# Patient Record
Sex: Female | Born: 1970 | Hispanic: Yes | Marital: Married | Smoking: Never smoker
Health system: Southern US, Community
[De-identification: ages and names within clinical notes are randomized; demographics above are authoritative.]

## PROBLEM LIST (undated history)

## (undated) DIAGNOSIS — R519 Headache, unspecified: Secondary | ICD-10-CM

## (undated) DIAGNOSIS — D649 Anemia, unspecified: Secondary | ICD-10-CM

## (undated) DIAGNOSIS — R51 Headache: Secondary | ICD-10-CM

## (undated) DIAGNOSIS — I499 Cardiac arrhythmia, unspecified: Secondary | ICD-10-CM

## (undated) HISTORY — PX: TUBAL LIGATION: SHX77

---

## 2007-09-10 ENCOUNTER — Ambulatory Visit: Payer: Self-pay

## 2007-10-01 ENCOUNTER — Ambulatory Visit: Payer: Self-pay

## 2013-02-12 ENCOUNTER — Emergency Department: Payer: Self-pay | Admitting: Emergency Medicine

## 2013-02-12 LAB — COMPREHENSIVE METABOLIC PANEL
Albumin: 3.6 g/dL (ref 3.4–5.0)
Alkaline Phosphatase: 107 U/L
BUN: 17 mg/dL (ref 7–18)
Calcium, Total: 8.2 mg/dL — ABNORMAL LOW (ref 8.5–10.1)
Co2: 27 mmol/L (ref 21–32)
Creatinine: 0.68 mg/dL (ref 0.60–1.30)
EGFR (Non-African Amer.): >60
Potassium: 3.8 mmol/L (ref 3.5–5.1)
Sodium: 141 mmol/L (ref 136–145)
Total Protein: 6.9 g/dL (ref 6.4–8.2)

## 2013-02-12 LAB — CBC
MCH: 21.8 pg — ABNORMAL LOW (ref 26.0–34.0)
MCHC: 31.8 g/dL — ABNORMAL LOW (ref 32.0–36.0)
MCV: 69 fL — ABNORMAL LOW (ref 80–100)
Platelet: 322 10*3/uL (ref 150–440)
RBC: 4.73 10*6/uL (ref 3.80–5.20)
WBC: 8.5 10*3/uL (ref 3.6–11.0)

## 2013-02-12 LAB — TSH: Thyroid Stimulating Horm: 2.03 u[IU]/mL

## 2014-11-30 ENCOUNTER — Other Ambulatory Visit: Payer: Self-pay | Admitting: Neurology

## 2014-11-30 DIAGNOSIS — R519 Headache, unspecified: Secondary | ICD-10-CM

## 2014-11-30 DIAGNOSIS — R51 Headache: Principal | ICD-10-CM

## 2014-12-08 ENCOUNTER — Ambulatory Visit: Admission: RE | Admit: 2014-12-08 | Payer: Managed Care, Other (non HMO) | Source: Ambulatory Visit

## 2014-12-08 ENCOUNTER — Ambulatory Visit
Admission: RE | Admit: 2014-12-08 | Discharge: 2014-12-08 | Disposition: A | Discharge status: Home / Self Care | Payer: Managed Care, Other (non HMO) | Source: Ambulatory Visit | Attending: Neurology | Admitting: Neurology

## 2014-12-08 DIAGNOSIS — R51 Headache: Secondary | ICD-10-CM | POA: Diagnosis present

## 2014-12-08 DIAGNOSIS — G8929 Other chronic pain: Secondary | ICD-10-CM

## 2016-01-04 ENCOUNTER — Encounter: Payer: Self-pay | Admitting: *Deleted

## 2016-01-05 ENCOUNTER — Ambulatory Visit: Payer: Self-pay | Admitting: Anesthesiology

## 2016-01-05 ENCOUNTER — Encounter: Payer: Self-pay | Admitting: *Deleted

## 2016-01-05 ENCOUNTER — Encounter
Admission: RE | Disposition: A | Discharge status: Home / Self Care | Payer: Self-pay | Source: Ambulatory Visit | Attending: Unknown Physician Specialty

## 2016-01-05 ENCOUNTER — Ambulatory Visit
Admission: RE | Admit: 2016-01-05 | Discharge: 2016-01-05 | Disposition: A | Discharge status: Home / Self Care | Payer: Self-pay | Source: Ambulatory Visit | Attending: Unknown Physician Specialty | Admitting: Unknown Physician Specialty

## 2016-01-05 DIAGNOSIS — K319 Disease of stomach and duodenum, unspecified: Secondary | ICD-10-CM | POA: Insufficient documentation

## 2016-01-05 DIAGNOSIS — R12 Heartburn: Secondary | ICD-10-CM | POA: Insufficient documentation

## 2016-01-05 DIAGNOSIS — K449 Diaphragmatic hernia without obstruction or gangrene: Secondary | ICD-10-CM | POA: Insufficient documentation

## 2016-01-05 HISTORY — DX: Anemia, unspecified: D64.9

## 2016-01-05 HISTORY — PX: ESOPHAGOGASTRODUODENOSCOPY (EGD) WITH PROPOFOL: SHX5813

## 2016-01-05 HISTORY — DX: Headache: R51

## 2016-01-05 HISTORY — DX: Cardiac arrhythmia, unspecified: I49.9

## 2016-01-05 HISTORY — DX: Headache, unspecified: R51.9

## 2016-01-05 LAB — POCT PREGNANCY, URINE: PREG TEST UR: NEGATIVE

## 2016-01-05 SURGERY — ESOPHAGOGASTRODUODENOSCOPY (EGD) WITH PROPOFOL
Anesthesia: General

## 2016-01-05 MED ORDER — FENTANYL CITRATE (PF) 100 MCG/2ML IJ SOLN
INTRAMUSCULAR | Status: DC | PRN
  Administered 2016-01-05: 50 ug via INTRAVENOUS

## 2016-01-05 MED ORDER — SODIUM CHLORIDE 0.9 % IV SOLN
INTRAVENOUS | Status: DC
  Administered 2016-01-05: 1000 mL via INTRAVENOUS

## 2016-01-05 MED ORDER — MIDAZOLAM HCL 2 MG/2ML IJ SOLN
INTRAMUSCULAR | Status: DC | PRN
  Administered 2016-01-05: 1 mg via INTRAVENOUS

## 2016-01-05 MED ORDER — LIDOCAINE HCL (CARDIAC) 20 MG/ML IV SOLN
INTRAVENOUS | Status: DC | PRN
  Administered 2016-01-05: 40 mg via INTRAVENOUS

## 2016-01-05 MED ORDER — PROPOFOL 500 MG/50ML IV EMUL
INTRAVENOUS | Status: DC | PRN
  Administered 2016-01-05: 150 ug/kg/min via INTRAVENOUS

## 2016-01-05 MED ORDER — PROPOFOL 10 MG/ML IV BOLUS
INTRAVENOUS | Status: DC | PRN
  Administered 2016-01-05: 80 mg via INTRAVENOUS

## 2016-01-05 MED ORDER — SODIUM CHLORIDE 0.9 % IV SOLN: INTRAVENOUS | Status: DC

## 2016-01-05 NOTE — Transfer of Care (Signed)
Immediate Anesthesia Transfer of Care Note  Patient: Bailey Mann  Procedure(s) Performed: Procedure(s): ESOPHAGOGASTRODUODENOSCOPY (EGD) WITH PROPOFOL (N/A)  Patient Location: PACU and Endoscopy Unit  Anesthesia Type:General  Level of Consciousness: sedated  Airway & Oxygen Therapy: Patient Spontanous Breathing and Patient connected to nasal cannula oxygen  Post-op Assessment: Report given to RN and Post -op Vital signs reviewed and stable  Post vital signs: Reviewed and stable  Last Vitals:  Vitals:   01/05/16 1048  BP: 119/67  Pulse: 73  Resp: 16  Temp: 36.4 C    Last Pain:  Vitals:   01/05/16 1048  TempSrc: Tympanic         Complications: No apparent anesthesia complications

## 2016-01-05 NOTE — Anesthesia Postprocedure Evaluation (Signed)
Anesthesia Post Note  Patient: Bailey Mann  Procedure(s) Performed: Procedure(s) (LRB): ESOPHAGOGASTRODUODENOSCOPY (EGD) WITH PROPOFOL (N/A)  Patient location during evaluation: PACU Anesthesia Type: General Level of consciousness: awake Pain management: pain level controlled Vital Signs Assessment: post-procedure vital signs reviewed and stable Respiratory status: spontaneous breathing Cardiovascular status: stable Anesthetic complications: no    Last Vitals:  Vitals:   01/05/16 1316 01/05/16 1326  BP: 109/77 112/76  Pulse: 66 65  Resp: 16 18  Temp:      Last Pain:  Vitals:   01/05/16 1307  TempSrc: Tympanic                 VAN STAVEREN,Kinnick Maus

## 2016-01-05 NOTE — H&P (Signed)
   Primary Care Physician:  Dorothey BasemanAVID BRONSTEIN, MD Primary Gastroenterologist:  Dr. Mechele CollinElliott  Pre-Procedure History & Physical: HPI:  Bailey Mann is a 45 y.o. female is here for an endoscopy.   Past Medical History:  Diagnosis Date  . Anemia   . Dysrhythmia   . Headache     Past Surgical History:  Procedure Laterality Date  . CESAREAN SECTION    . TUBAL LIGATION      Prior to Admission medications   Medication Sig Start Date End Date Taking? Authorizing Provider  omeprazole (PRILOSEC) 40 MG capsule Take 40 mg by mouth daily.   Yes Historical Provider, MD    Allergies as of 12/23/2015  . (Not on File)    History reviewed. No pertinent family history.  Social History   Social History  . Marital status: Married    Spouse name: N/A  . Number of children: N/A  . Years of education: N/A   Occupational History  . Not on file.   Social History Main Topics  . Smoking status: Never Smoker  . Smokeless tobacco: Never Used  . Alcohol use Not on file  . Drug use: Unknown  . Sexual activity: Not on file   Other Topics Concern  . Not on file   Social History Narrative  . No narrative on file    Review of Systems: See HPI, otherwise negative ROS  Physical Exam: BP 119/67   Pulse 73   Temp 97.6 F (36.4 C) (Tympanic)   Resp 16   Ht 5' (1.524 m)   Wt 70.3 kg (155 lb)   SpO2 100%   BMI 30.27 kg/m  General:   Alert,  pleasant and cooperative in NAD Head:  Normocephalic and atraumatic. Neck:  Supple; no masses or thyromegaly. Lungs:  Clear throughout to auscultation.    Heart:  Regular rate and rhythm. Abdomen:  Soft, nontender and nondistended. Normal bowel sounds, without guarding, and without rebound.   Neurologic:  Alert and  oriented x4;  grossly normal neurologically.  Impression/Plan: Bailey Mann is here for an endoscopy to be performed for heartburn.  Risks, benefits, limitations, and alternatives regarding  endoscopy have been  reviewed with the patient.  Questions have been answered.  All parties agreeable.   Lynnae PrudeELLIOTT, ROBERT, MD  01/05/2016, 12:48 PM

## 2016-01-05 NOTE — Anesthesia Preprocedure Evaluation (Signed)
Anesthesia Evaluation  Patient identified by MRN, date of birth, ID band Patient awake    Airway Mallampati: I       Dental  (+) Teeth Intact   Pulmonary neg pulmonary ROS,    breath sounds clear to auscultation       Cardiovascular Exercise Tolerance: Good I Rhythm:Regular Rate:Normal     Neuro/Psych Anxiety negative neurological ROS     GI/Hepatic negative GI ROS, Neg liver ROS,   Endo/Other  negative endocrine ROS  Renal/GU negative Renal ROS     Musculoskeletal   Abdominal Normal abdominal exam  (+)   Peds  Hematology  (+) anemia ,   Anesthesia Other Findings   Reproductive/Obstetrics                             Anesthesia Physical Anesthesia Plan  ASA: I  Anesthesia Plan: General   Post-op Pain Management:    Induction: Intravenous  Airway Management Planned: Natural Airway and Nasal Cannula  Additional Equipment:   Intra-op Plan:   Post-operative Plan:   Informed Consent: I have reviewed the patients History and Physical, chart, labs and discussed the procedure including the risks, benefits and alternatives for the proposed anesthesia with the patient or authorized representative who has indicated his/her understanding and acceptance.     Plan Discussed with: CRNA  Anesthesia Plan Comments:         Anesthesia Quick Evaluation

## 2016-01-05 NOTE — Op Note (Signed)
[facility] Gastroenterology Patient Name: Bailey Mann [facility] Procedure Date: 01/05/2016 12:38 PM MRN: [id number] Account #: [id number] Date of Birth: 10-Oct-1970 Admit Type: Outpatient Age: 45 Room: [facility] ENDO ROOM 4 Gender: Female Note Status: Finalized Procedure:            Upper GI endoscopy Indications:          Heartburn Providers:            Scot Jun, MD Referring MD:         Teena Irani. Terance Hart, MD (Referring MD) Medicines:            Propofol per Anesthesia Complications:        No immediate complications. Procedure:            Pre-Anesthesia Assessment:                       - After reviewing the risks and benefits, the patient                        was deemed in satisfactory condition to undergo the                        procedure.                       After obtaining informed consent, the endoscope was                        passed under direct vision. Throughout the procedure,                        the patient's blood pressure, pulse, and oxygen                        saturations were monitored continuously. The Endoscope                        was introduced through the mouth, and advanced to the                        second part of duodenum. The upper GI endoscopy was                        accomplished without difficulty. The patient tolerated                        the procedure well. Findings:      The examined esophagus was normal. Distal esophagus had lots of       prominent vessels. GEJ 36-37cm.      A small hiatal hernia was present.      Patchy mildly erythematous mucosa without bleeding was found in the       gastric antrum. Biopsies were taken with a cold forceps for histology.       Biopsies were taken with a cold forceps for Helicobacter pylori testing.      The examined duodenum was normal. Impression:           - Normal esophagus.                       - Small hiatal hernia.                       -  Erythematous  mucosa in the antrum. Biopsied.                       - Normal examined duodenum. Recommendation:       - Await pathology results. Stay on Omeprazole. Scot Junobert T Elliott, MD 01/05/2016 1:02:33 PM This report has been signed electronically. Number of Addenda: 0 Note Initiated On: 01/05/2016 12:38 PM      [facility]

## 2016-01-07 LAB — SURGICAL PATHOLOGY

## 2016-01-08 ENCOUNTER — Encounter: Payer: Self-pay | Admitting: Unknown Physician Specialty

## 2016-07-20 ENCOUNTER — Other Ambulatory Visit: Payer: Self-pay

## 2017-07-10 ENCOUNTER — Other Ambulatory Visit: Payer: Self-pay | Admitting: Student

## 2017-07-10 DIAGNOSIS — K2950 Unspecified chronic gastritis without bleeding: Secondary | ICD-10-CM

## 2017-07-10 DIAGNOSIS — K449 Diaphragmatic hernia without obstruction or gangrene: Secondary | ICD-10-CM

## 2017-07-10 DIAGNOSIS — K219 Gastro-esophageal reflux disease without esophagitis: Secondary | ICD-10-CM

## 2017-07-11 ENCOUNTER — Other Ambulatory Visit
Admission: RE | Admit: 2017-07-11 | Discharge: 2017-07-11 | Disposition: A | Discharge status: Home / Self Care | Payer: Self-pay | Source: Ambulatory Visit | Attending: Student | Admitting: Student

## 2017-07-11 DIAGNOSIS — K219 Gastro-esophageal reflux disease without esophagitis: Secondary | ICD-10-CM | POA: Insufficient documentation

## 2017-07-11 DIAGNOSIS — Z8619 Personal history of other infectious and parasitic diseases: Secondary | ICD-10-CM | POA: Insufficient documentation

## 2017-07-12 LAB — H. PYLORI ANTIGEN, STOOL: H. Pylori Stool Ag, Eia: NEGATIVE

## 2017-07-13 ENCOUNTER — Ambulatory Visit
Admission: RE | Admit: 2017-07-13 | Discharge: 2017-07-13 | Disposition: A | Discharge status: Home / Self Care | Payer: Self-pay | Source: Ambulatory Visit | Attending: Student | Admitting: Student

## 2017-07-13 DIAGNOSIS — K2950 Unspecified chronic gastritis without bleeding: Secondary | ICD-10-CM | POA: Insufficient documentation

## 2017-07-13 DIAGNOSIS — K449 Diaphragmatic hernia without obstruction or gangrene: Secondary | ICD-10-CM | POA: Insufficient documentation

## 2017-07-13 DIAGNOSIS — K219 Gastro-esophageal reflux disease without esophagitis: Secondary | ICD-10-CM | POA: Insufficient documentation

## 2017-09-17 ENCOUNTER — Ambulatory Visit: Payer: Self-pay | Attending: Oncology | Admitting: *Deleted

## 2017-09-17 ENCOUNTER — Ambulatory Visit
Admission: RE | Admit: 2017-09-17 | Discharge: 2017-09-17 | Disposition: A | Discharge status: Home / Self Care | Payer: Self-pay | Source: Ambulatory Visit | Attending: Oncology | Admitting: Oncology

## 2017-09-17 ENCOUNTER — Encounter (INDEPENDENT_AMBULATORY_CARE_PROVIDER_SITE_OTHER): Payer: Self-pay

## 2017-09-17 VITALS — BP 128/87 | HR 81 | Temp 98.5°F | Ht 61.0 in | Wt 165.0 lb

## 2017-09-17 DIAGNOSIS — Z0000 Encounter for general adult medical examination without abnormal findings: Secondary | ICD-10-CM | POA: Insufficient documentation

## 2017-09-17 NOTE — Progress Notes (Signed)
  Subjective:     Patient ID: Bertha StakesJosefa Cruz Marroquin, female   DOB: [birth year], 47 y.o.   MRN: [id number]  HPI   Review of Systems     Objective:   Physical Exam  Pulmonary/Chest: Right breast exhibits no inverted nipple, no mass, no nipple discharge, no skin change and no tenderness. Left breast exhibits tenderness. Left breast exhibits no inverted nipple, no mass, no nipple discharge and no skin change.         Assessment:     47 year old English speaking Hispanic female [facility] and [facility].  [facility].  Clinical breast exam without dominant mass, nipple discharge, skin changes or lymphadenopathy.  Patient states she has occasional breast pain at 6:00 left breast for the last 10 years.  Taught self breast awareness.  Patient has been screened for eligibility.  She does not have any insurance, Medicare or Medicaid.  She also meets financial eligibility.  Hand-out given on the Affordable Care Act.    Plan:     Will get screening mammogram since the patients targeted pain has been present for 10 years.  Will follow-up per [facility] protocol.

## 2017-09-17 NOTE — Patient Instructions (Signed)
Gave patient hand-out, Women Staying Healthy, Active and Well from [facility], with education on breast health, pap smears, heart and colon health. 

## 2017-09-18 ENCOUNTER — Encounter: Payer: Self-pay | Admitting: *Deleted

## 2017-09-18 NOTE — Progress Notes (Signed)
Letter mailed from the [facility] to inform patient of her normal mammogram results.  Patient is to follow-up with annual screening in one year.  HSIS to Christy. 

## 2021-03-02 ENCOUNTER — Emergency Department
Admission: EM | Admit: 2021-03-02 | Discharge: 2021-03-02 | Disposition: A | Discharge status: Home / Self Care | Payer: Self-pay | Attending: Emergency Medicine | Admitting: Emergency Medicine

## 2021-03-02 ENCOUNTER — Other Ambulatory Visit: Payer: Self-pay

## 2021-03-02 ENCOUNTER — Emergency Department: Payer: Self-pay

## 2021-03-02 DIAGNOSIS — R002 Palpitations: Secondary | ICD-10-CM | POA: Insufficient documentation

## 2021-03-02 DIAGNOSIS — R0789 Other chest pain: Secondary | ICD-10-CM | POA: Insufficient documentation

## 2021-03-02 DIAGNOSIS — R0602 Shortness of breath: Secondary | ICD-10-CM | POA: Insufficient documentation

## 2021-03-02 LAB — BASIC METABOLIC PANEL
Anion gap: 7 (ref 5–15)
BUN: 17 mg/dL (ref 6–20)
CO2: 26 mmol/L (ref 22–32)
Calcium: 8.9 mg/dL (ref 8.9–10.3)
Chloride: 104 mmol/L (ref 98–111)
Creatinine, Ser: 0.45 mg/dL (ref 0.44–1.00)
GFR, Estimated: >60 mL/min (ref >60)
Glucose, Bld: 111 mg/dL — ABNORMAL HIGH (ref 70–99)
Potassium: 3.3 mmol/L — ABNORMAL LOW (ref 3.5–5.1)
Sodium: 137 mmol/L (ref 135–145)

## 2021-03-02 LAB — CBC
HCT: 42.2 % (ref 36.0–46.0)
Hemoglobin: 13.9 g/dL (ref 12.0–15.0)
MCH: 28 pg (ref 26.0–34.0)
MCHC: 32.9 g/dL (ref 30.0–36.0)
MCV: 84.9 fL (ref 80.0–100.0)
Platelets: 333 10*3/uL (ref 150–400)
RBC: 4.97 MIL/uL (ref 3.87–5.11)
RDW: 12.8 % (ref 11.5–15.5)
WBC: 7.3 10*3/uL (ref 4.0–10.5)
nRBC: 0 % (ref 0.0–0.2)

## 2021-03-02 LAB — TROPONIN I (HIGH SENSITIVITY)
Troponin I (High Sensitivity): 4 ng/L (ref <18)
Troponin I (High Sensitivity): 4 ng/L (ref <18)

## 2021-03-02 MED ORDER — SUCRALFATE 1 G PO TABS: 1.0000 g | ORAL_TABLET | Freq: Four times a day (QID) | ORAL | 0 refills | Status: AC | PRN

## 2021-03-02 MED ORDER — ALUM & MAG HYDROXIDE-SIMETH 200-200-20 MG/5ML PO SUSP
15.0000 mL | Freq: Once | ORAL | Status: AC
  Administered 2021-03-02: 11:00:00 15 mL via ORAL
  Filled 2021-03-02: qty 30

## 2021-03-02 MED ORDER — FAMOTIDINE 20 MG PO TABS
20.0000 mg | ORAL_TABLET | Freq: Once | ORAL | Status: AC
  Administered 2021-03-02: 11:00:00 20 mg via ORAL
  Filled 2021-03-02: qty 1

## 2021-03-02 NOTE — ED Provider Notes (Signed)
[facility] Emergency Department Provider Note ____________________________________________   Event Date/Time   First MD Initiated Contact with Patient 03/02/21 [phone]     (approximate)  I have reviewed the triage vital signs and the nursing notes.   HISTORY  Chief Complaint Palpitations  HPI and ROS obtained via video interpreter  HPI Bailey Mann is a 50 y.o. female with PMH as noted below as well as a history of GERD who presents with chest discomfort, acute onset last night, resolved this morning and now present again, described as a sensation of a balloon inflating, and radiating up towards her neck and head.  The patient reports some associated shortness of breath but denies nausea or vomiting, dizziness, or weakness.  She states that she had a similar episode about 5 years ago and had an electrocardiogram and some other tests done which were all negative.  She states that she has acid reflux and takes Tums and omeprazole intermittently; she took them today but has not had relief.   Past Medical History:  Diagnosis Date   Anemia    Dysrhythmia    Headache     There are no problems to display for this patient.   Past Surgical History:  Procedure Laterality Date   CESAREAN SECTION     ESOPHAGOGASTRODUODENOSCOPY (EGD) WITH PROPOFOL N/A 01/05/2016   Procedure: ESOPHAGOGASTRODUODENOSCOPY (EGD) WITH PROPOFOL;  Surgeon: Scot Jun, MD;  Location: [facility] ENDOSCOPY;  Service: Endoscopy;  Laterality: N/A;   TUBAL LIGATION      Prior to Admission medications   Medication Sig Start Date End Date Taking? Authorizing Provider  sucralfate (CARAFATE) 1 g tablet Take 1 tablet (1 g total) by mouth 4 (four) times daily as needed. 03/02/21 03/02/22 Yes Dionne Bucy, MD  omeprazole (PRILOSEC) 40 MG capsule Take 40 mg by mouth daily.    [provider]    Allergies Patient has no known allergies.  No family history on  file.  Social History Social History   Tobacco Use   Smoking status: Never   Smokeless tobacco: Never    Review of Systems  Constitutional: No fever/chills Eyes: No visual changes. ENT: No sore throat. Cardiovascular: Positive for chest discomfort. Respiratory: Positive for shortness of breath. Gastrointestinal: No vomiting or diarrhea.  Genitourinary: Negative for dysuria.  Musculoskeletal: Negative for back pain. Skin: Negative for rash. Neurological: Negative for headaches, focal weakness or numbness.   ____________________________________________   PHYSICAL EXAM:  VITAL SIGNS: ED Triage Vitals  Enc Vitals Group     BP 03/02/21 0837 (!) 142/84     Pulse Rate 03/02/21 0837 82     Resp 03/02/21 0837 18     Temp 03/02/21 0841 98 F (36.7 C)     Temp Source 03/02/21 0841 Oral     SpO2 03/02/21 0837 100 %     Weight 03/02/21 0839 172 lb (78 kg)     Height 03/02/21 0839 5' (1.524 m)     Head Circumference --      Peak Flow --      Pain Score 03/02/21 0839 0     Pain Loc --      Pain Edu? --      Excl. in GC? --     Constitutional: Alert and oriented.  Anxious appearing but in no acute distress. Eyes: Conjunctivae are normal.  Head: Atraumatic. Nose: No congestion/rhinnorhea. Mouth/Throat: Mucous membranes are moist.   Neck: Normal range of motion.  Cardiovascular: Normal rate, regular rhythm.  Grossly normal heart sounds.  Good peripheral circulation. Respiratory: Normal respiratory effort.  No retractions. Lungs CTAB. Gastrointestinal: Soft and nontender. No distention.  Genitourinary: No flank tenderness. Musculoskeletal: No lower extremity edema.  No calf or popliteal swelling or tenderness.  Extremities warm and well perfused.  Neurologic:  Normal speech and language. Motor intact in all extremities.  Normal coordination.  No gross focal neurologic deficits are appreciated.  Skin:  Skin is warm and dry. No rash noted. Psychiatric: Anxious  appearing.  ____________________________________________   LABS (all labs ordered are listed, but only abnormal results are displayed)  Labs Reviewed  BASIC METABOLIC PANEL - Abnormal; Notable for the following components:      Result Value   Potassium 3.3 (*)    Glucose, Bld 111 (*)    All other components within normal limits  CBC  POC URINE PREG, ED  TROPONIN I (HIGH SENSITIVITY)  TROPONIN I (HIGH SENSITIVITY)   ____________________________________________  EKG  ED ECG REPORT I, Dionne Bucy, the attending physician, personally viewed and interpreted this ECG.  Date: 03/02/2021 EKG Time: 08 29 Rate: 78 Rhythm: normal sinus rhythm QRS Axis: normal Intervals: normal ST/T Wave abnormalities: Nonspecific T wave abnormalities Narrative Interpretation: no evidence of acute ischemia; no significant change when compared to EKG of 02/12/2013  ____________________________________________  RADIOLOGY  Chest x-ray interpreted by me shows no focal consolidation or edema  ____________________________________________   PROCEDURES  Procedure(s) performed: No  Procedures  Critical Care performed: No ____________________________________________   INITIAL IMPRESSION / ASSESSMENT AND PLAN / ED COURSE  Pertinent labs & imaging results that were available during my care of the patient were reviewed by me and considered in my medical decision making (see chart for details).   50 year old female with PMH as noted above presents with atypical chest discomfort which started during the night and was associated with some shortness of breath.  It has been somewhat intermittent since then and is present currently.  She has had a similar episode in the past.  On exam she is overall well-appearing although anxious.  Her vital signs are normal.  The physical exam is unremarkable otherwise.  Work-up obtained from triage is all within normal limits including a nonischemic EKG,  negative chest x-ray, and normal basic labs.  Differential includes GERD, musculoskeletal pain, radiculopathy or other nerve pain, anxiety, or other benign cause.  The patient is PERC negative and the symptoms are not consistent with PE or other vascular etiology.  I have a very low suspicion for ACS.  We will give Pepcid and Maalox, obtain a repeat troponin, and reassess.  ----------------------------------------- 12:35 PM on 03/02/2021 -----------------------------------------  The patient states that she feels about the same, possibly slightly better, but appears more comfortable and less anxious.  Repeat troponin is negative.  The patient is stable for discharge home at this time.  Via the video interpreter I counseled the patient on the results of the work-up and the plan of care, answered all of her questions, and gave her thorough return precautions; she expressed understanding.  I will prescribe Carafate for symptomatic treatment and referred the patient to her PMD and cardiology.  ____________________________________________   FINAL CLINICAL IMPRESSION(S) / ED DIAGNOSES  Final diagnoses:  Atypical chest pain      NEW MEDICATIONS STARTED DURING THIS VISIT:  New Prescriptions   SUCRALFATE (CARAFATE) 1 G TABLET    Take 1 tablet (1 g total) by mouth 4 (four) times daily as needed.     Note:  This document was prepared using Dragon voice recognition software and may include unintentional dictation errors.    Dionne Bucy, MD 03/02/21 1236

## 2021-03-02 NOTE — ED Triage Notes (Signed)
Interpreter # [phone] used Pt reports since she was small she has had chest pressure and warm sensation. Reports worsening sx last night.  +shob. RR even and unlabored, speaking in complete sentences.

## 2021-03-02 NOTE — Discharge Instructions (Signed)
Contine tomando el omeprazole. Puede tomar el carafate recetado hoy para un alivio adicional del dolor. Regrese a la sala de emergencias si tiene dolor torcico intenso nuevo, que empeora o persiste, dificultad para respirar, dolor de [place], debilidad o entumecimiento o cualquier otro sntoma nuevo o que empeora que le preocupe.

## 2021-05-20 ENCOUNTER — Other Ambulatory Visit: Payer: Self-pay

## 2021-05-20 DIAGNOSIS — Z1231 Encounter for screening mammogram for malignant neoplasm of breast: Secondary | ICD-10-CM

## 2021-06-13 ENCOUNTER — Other Ambulatory Visit: Payer: Self-pay

## 2021-06-13 DIAGNOSIS — Z1211 Encounter for screening for malignant neoplasm of colon: Secondary | ICD-10-CM

## 2021-06-14 ENCOUNTER — Ambulatory Visit
Admission: RE | Admit: 2021-06-14 | Discharge: 2021-06-14 | Disposition: A | Discharge status: Home / Self Care | Payer: Self-pay | Source: Ambulatory Visit | Attending: Obstetrics and Gynecology | Admitting: Obstetrics and Gynecology

## 2021-06-14 ENCOUNTER — Other Ambulatory Visit: Payer: Self-pay

## 2021-06-14 ENCOUNTER — Ambulatory Visit: Payer: Self-pay | Attending: Hematology and Oncology | Admitting: *Deleted

## 2021-06-14 VITALS — BP 126/74 | Wt 176.0 lb

## 2021-06-14 DIAGNOSIS — Z0000 Encounter for general adult medical examination without abnormal findings: Secondary | ICD-10-CM

## 2021-06-14 DIAGNOSIS — Z1231 Encounter for screening mammogram for malignant neoplasm of breast: Secondary | ICD-10-CM | POA: Insufficient documentation

## 2021-06-14 NOTE — Progress Notes (Addendum)
?  Subjective:  ?  ? Patient ID: Bailey Mann, female   DOB: 12-02-1970, 51 y.o.   MRN: [id number] ? ?HPI ? ? ? ? ?Review of Systems ? ?   ?Objective:  ? Physical Exam ?Chest:  ?Breasts: ?   Right: No swelling, bleeding, inverted nipple, mass, nipple discharge, skin change or tenderness.  ?   Left: No swelling, bleeding, inverted nipple, mass, nipple discharge, skin change or tenderness.  ? ? ?Lymphadenopathy:  ?   Upper Body:  ?   Right upper body: No supraclavicular or axillary adenopathy.  ?   Left upper body: No supraclavicular or axillary adenopathy.  ? ?   ?Assessment:  ?   ?51 year old English speaking Hispanic female presents to [facility] for clinical breast exam and mammogram.  Clinical breast exam without dominant mass, nipple discharge, skin changes or lymphadenopathy.  Taught self breast awareness.  Last pap on 08/09/17 was negative / negative.  Next pap due in 2024.  Patient has been screened for eligibility.  She does not have any insurance, Medicare or Medicaid.   ?Risk Assessment   ? ? Risk Scores   ? ?   06/14/2021 09/17/2017  ? Last edited by: Drue Dun, RN Rico Junker, RN  ? 5-year risk: 0.6 % 0.5 %  ? Lifetime risk: 5.2 % 5.5 %  ? ?  ?  ? ?  ?  ?   ?Plan:  ?   ?Screening mammogram ordered.  Will follow up per [facility] protocol. ?   ?

## 2023-06-19 IMAGING — MG MM DIGITAL SCREENING BILAT W/ TOMO AND CAD
6 of 10 series · 6 of 30 positions shown · non-contrast
Comparison: Previous exam(s).

CLINICAL DATA: Screening.

EXAM:
DIGITAL SCREENING BILATERAL MAMMOGRAM WITH TOMOSYNTHESIS AND CAD
TECHNIQUE: Bilateral screening digital craniocaudal and mediolateral oblique
mammograms were obtained. Bilateral screening digital breast
tomosynthesis was performed. The images were evaluated with
computer-aided detection.

[L MLO synth-2D (1 of 2)]
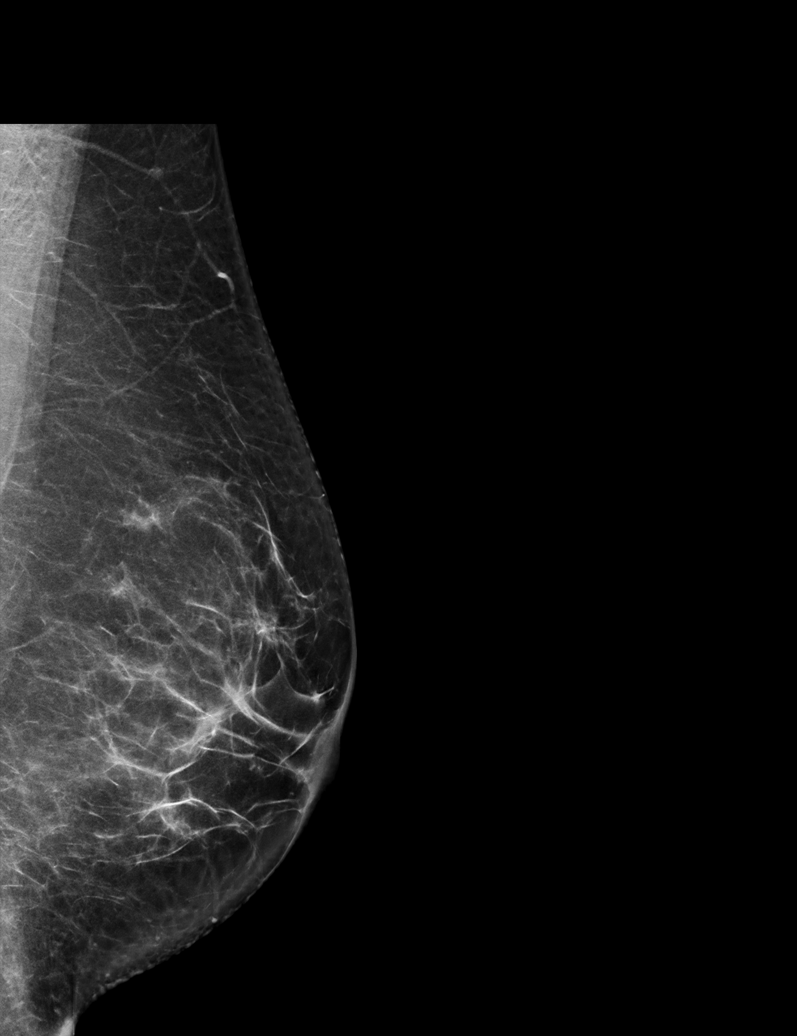

[L CC synth-2D]
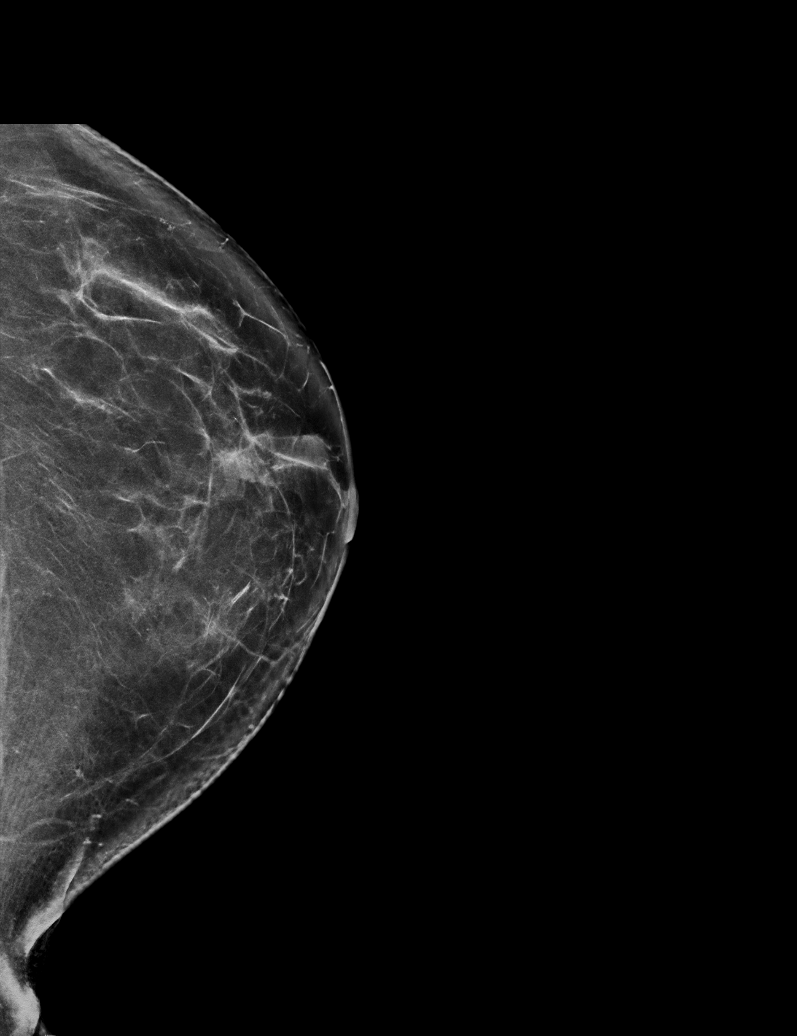

[R MLO synth-2D]
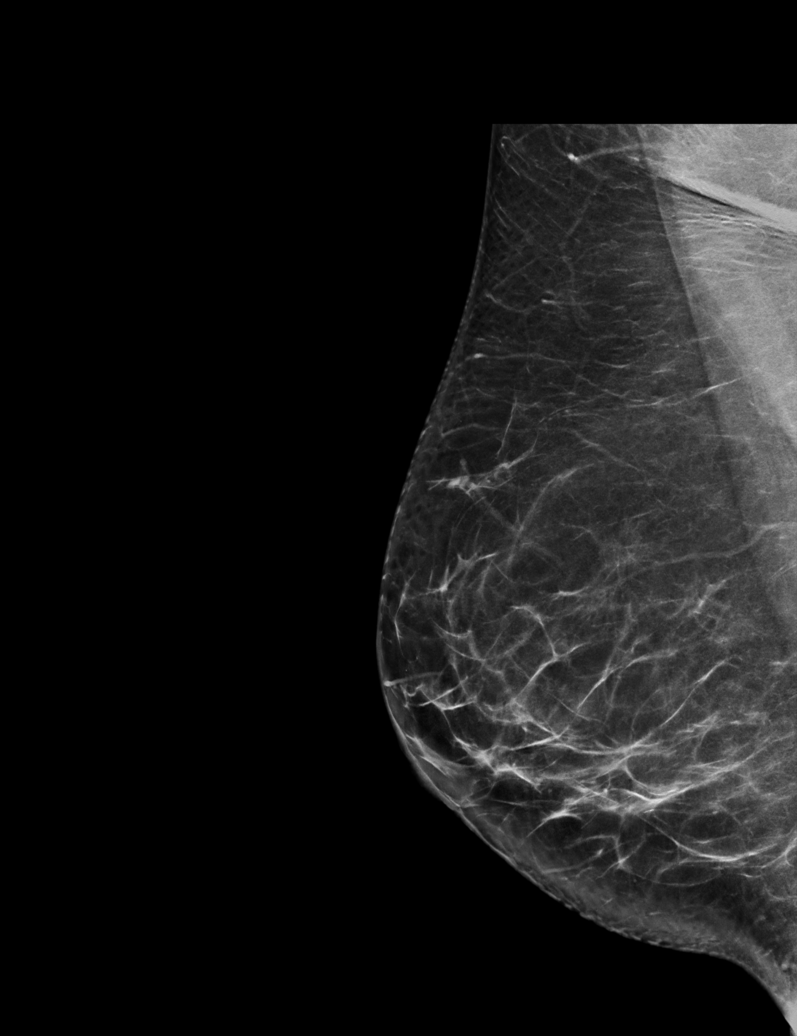

[R CC synth-2D]
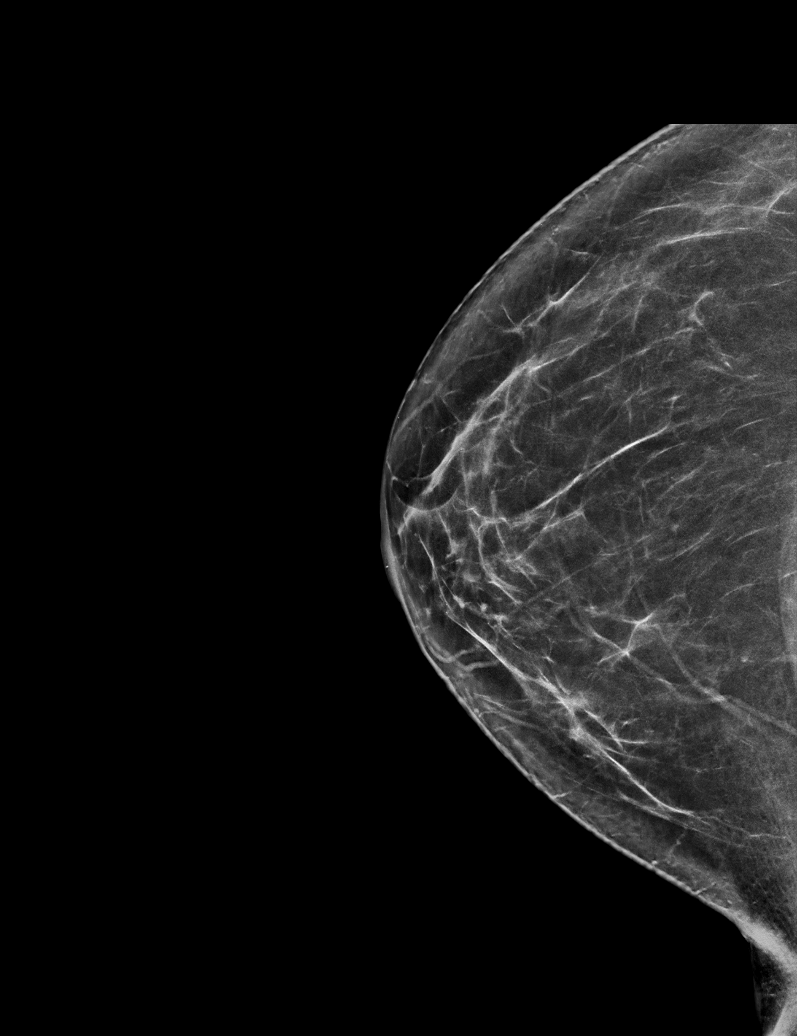

[L MLO synth-2D (2 of 2)]
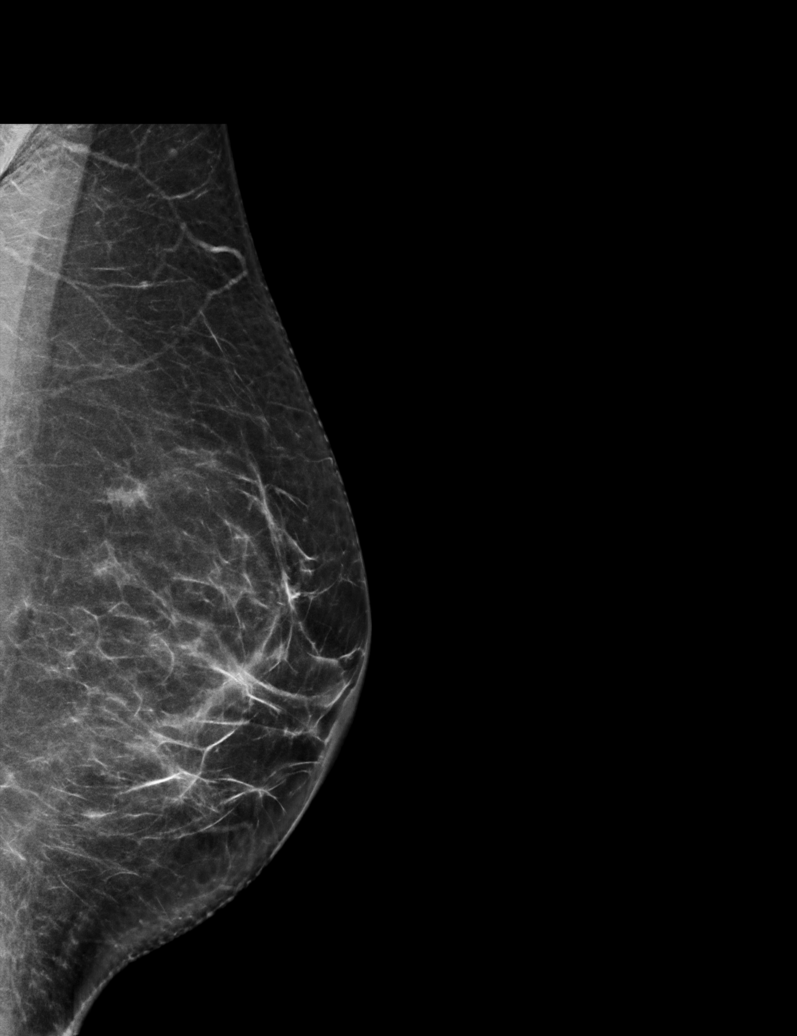

[L MLO tomo · tomo slice 35/68.0]
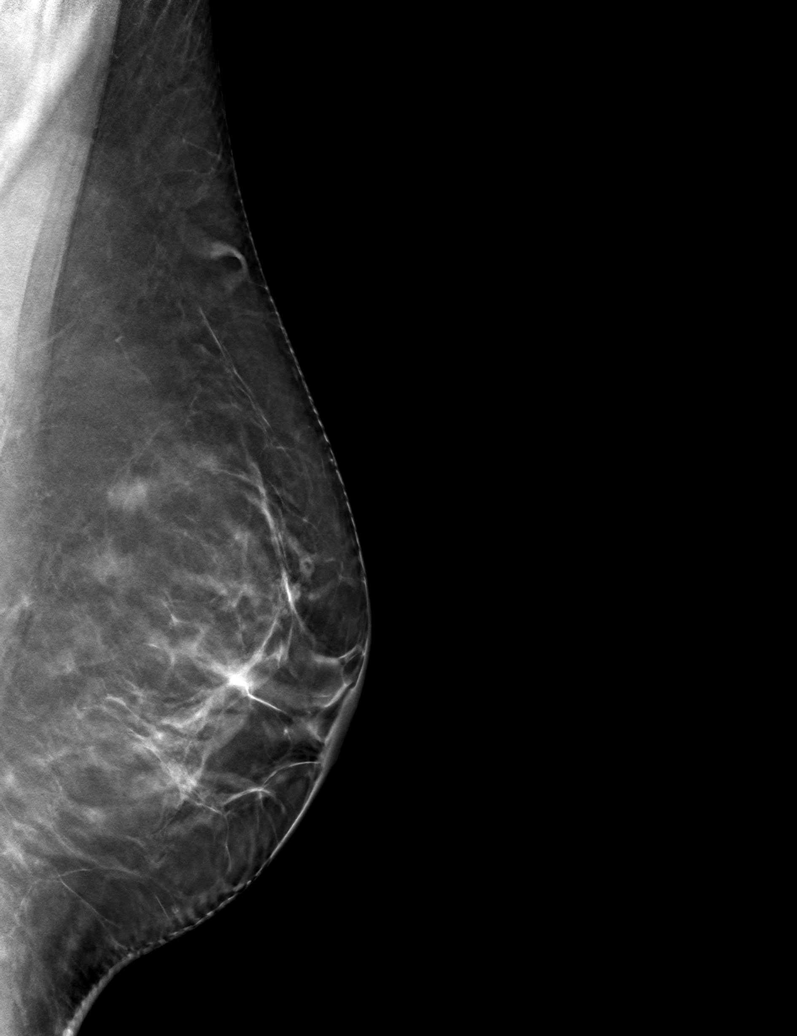

[6 of 30 positions shown; findings below may reference images not displayed]

ACR Breast Density Category b: There are scattered areas of
fibroglandular density.
FINDINGS: There are no findings suspicious for malignancy.
IMPRESSION: No mammographic evidence of malignancy. A result letter of this
screening mammogram will be mailed directly to the patient.

RECOMMENDATION:
Screening mammogram in one year. (Code:51-O-LD2)

BI-RADS CATEGORY  1: Negative.
